# Patient Record
Sex: Male | Born: 1995 | Race: Black or African American | Hispanic: No | Marital: Single | Smoking: Never smoker
Health system: Southern US, Community
[De-identification: ages and names within clinical notes are randomized; demographics above are authoritative.]

---
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
## 2016-09-07 IMAGING — DX DG CHEST 2V
2 series · 2 of 2 positions shown · non-contrast
Comparison: None.

CLINICAL DATA: Mid chest pain and pressure since yesterday.

EXAM:
CHEST  2 VIEW

[chest pa]
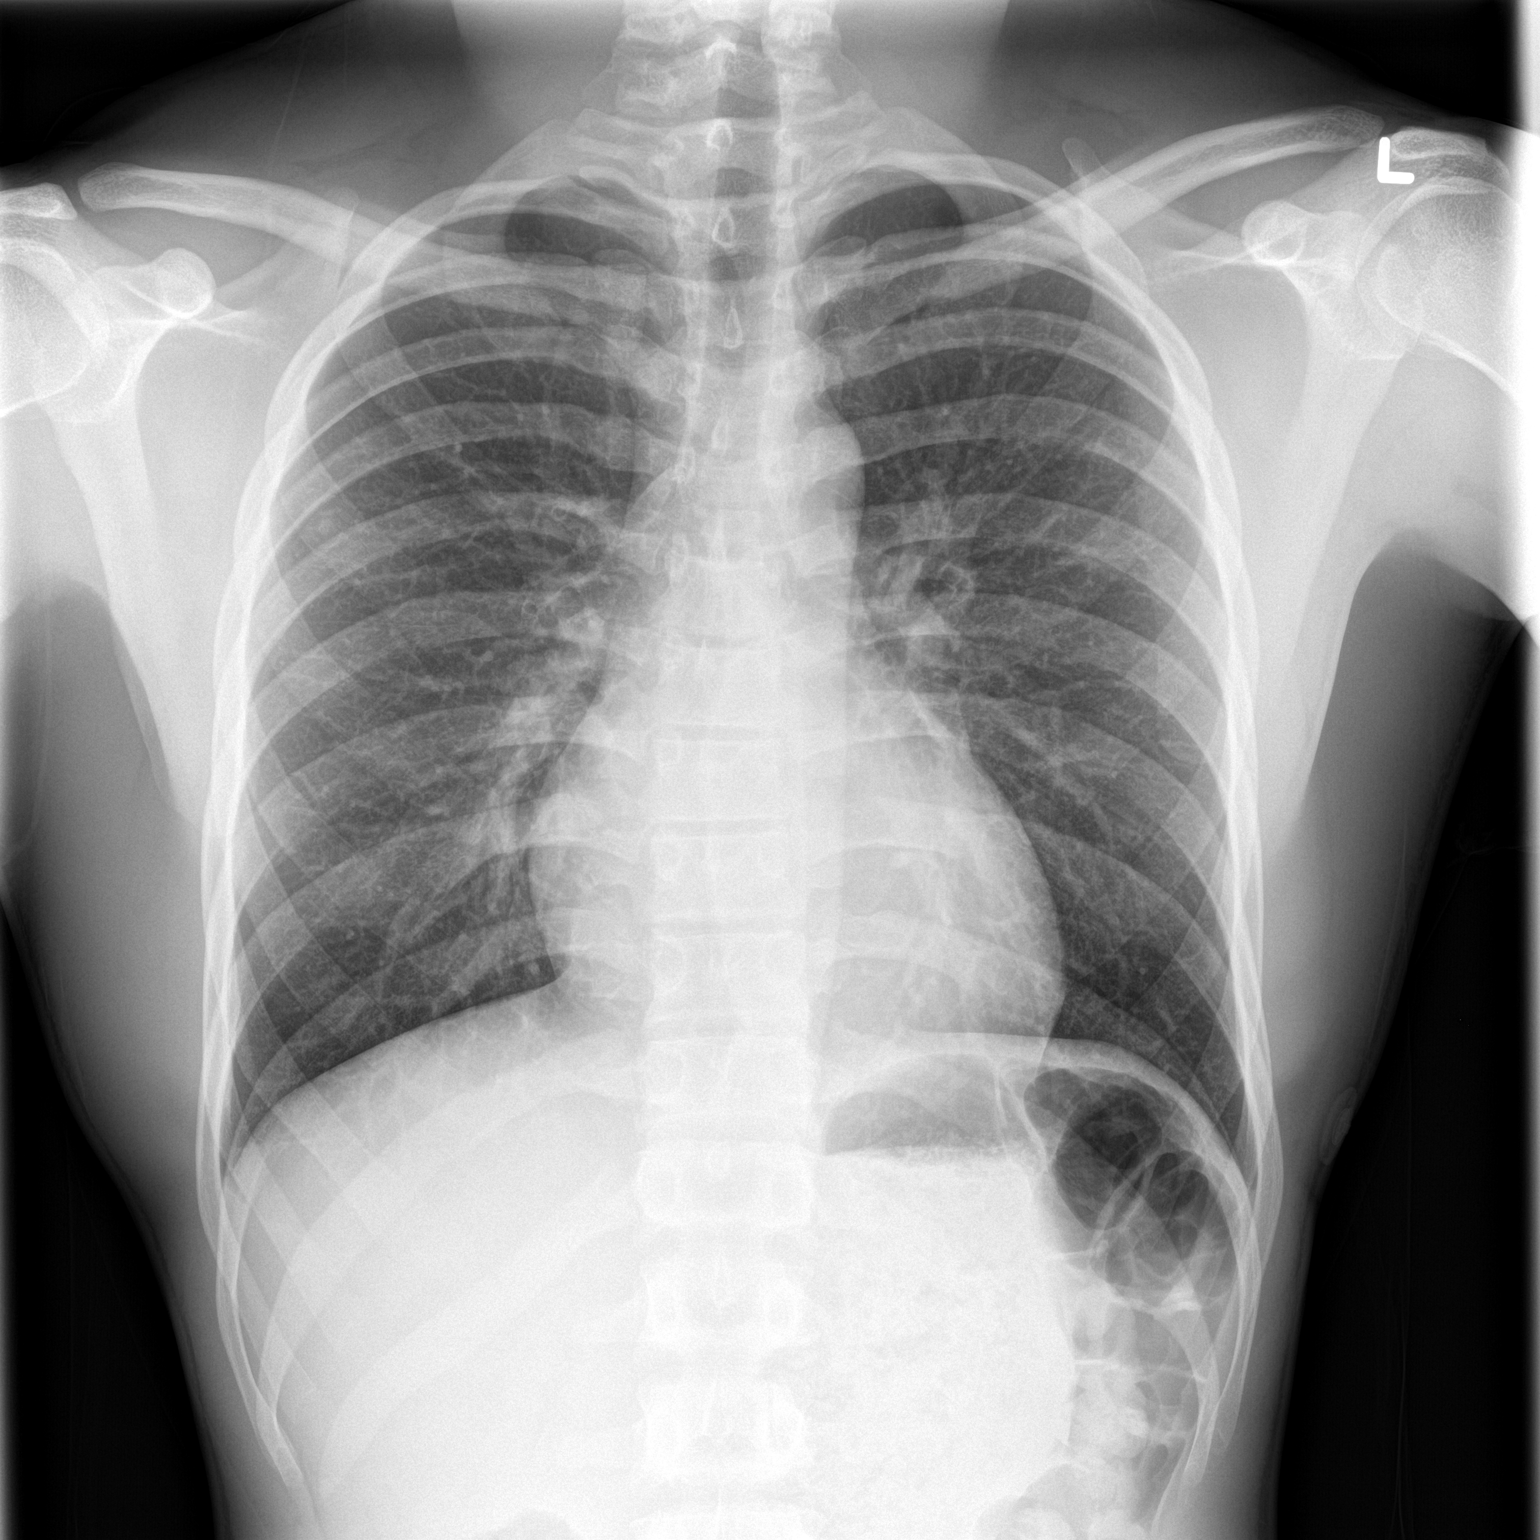

[chest lat]
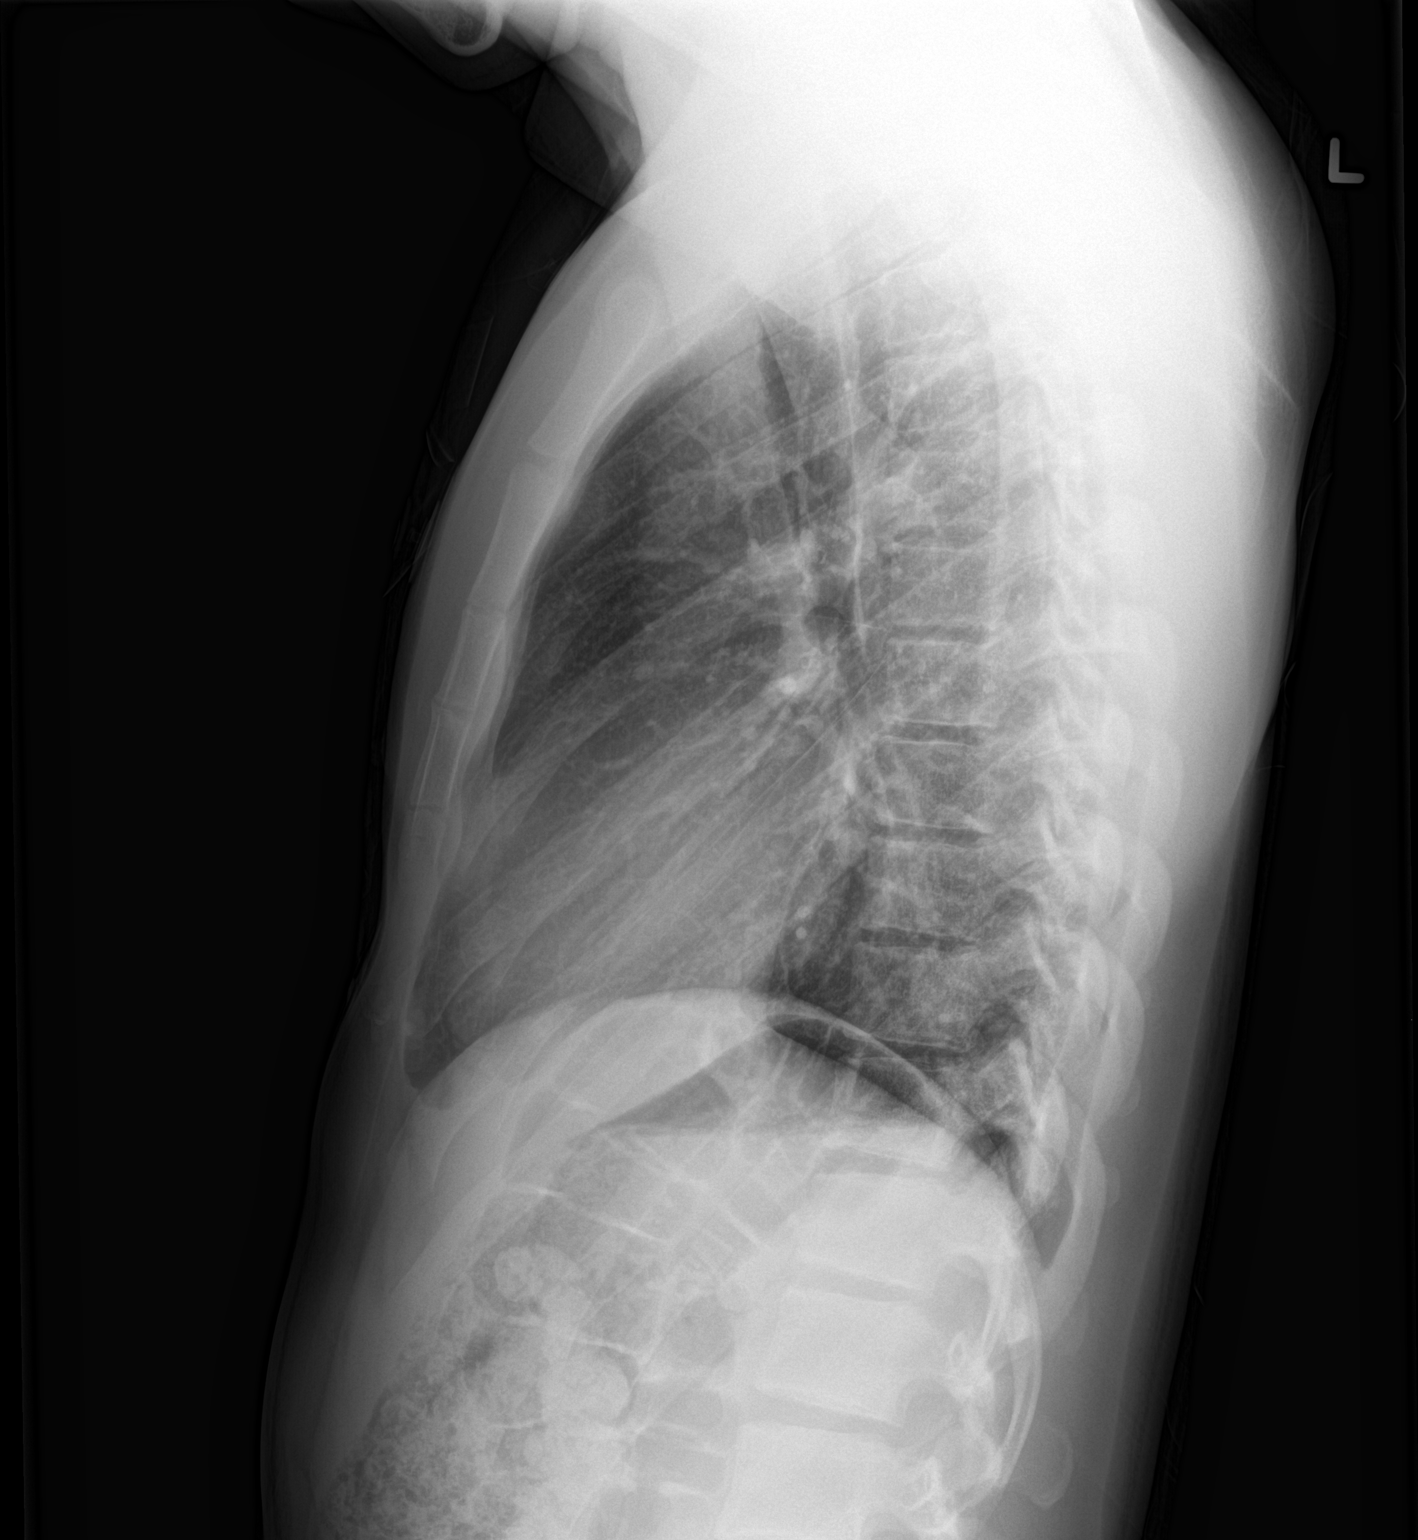

[2 of 2 positions shown; findings below may reference images not displayed]

FINDINGS: The heart size and mediastinal contours are within normal limits.
Both lungs are clear. The visualized skeletal structures are
unremarkable.
IMPRESSION: No active cardiopulmonary disease.
